# Patient Record
Sex: Male | Born: 1975 | Race: White | Hispanic: No | Marital: Married | Smoking: Former smoker
Health system: Southern US, Community
[De-identification: ages and names within clinical notes are randomized; demographics above are authoritative.]

## PROBLEM LIST (undated history)

## (undated) DIAGNOSIS — J45909 Unspecified asthma, uncomplicated: Secondary | ICD-10-CM

## (undated) DIAGNOSIS — I10 Essential (primary) hypertension: Secondary | ICD-10-CM

## (undated) DIAGNOSIS — R011 Cardiac murmur, unspecified: Secondary | ICD-10-CM

## (undated) HISTORY — PX: CERVICAL DISC SURGERY: SHX588

## (undated) HISTORY — DX: Essential (primary) hypertension: I10

## (undated) HISTORY — DX: Unspecified asthma, uncomplicated: J45.909

## (undated) HISTORY — DX: Cardiac murmur, unspecified: R01.1

## (undated) HISTORY — PX: TYMPANOSTOMY TUBE PLACEMENT: SHX32

---

## 2007-03-07 ENCOUNTER — Emergency Department: Payer: Self-pay

## 2014-04-16 ENCOUNTER — Emergency Department: Payer: Self-pay | Admitting: Emergency Medicine

## 2014-04-16 LAB — CBC
HCT: 46.6 % (ref 40.0–52.0)
HGB: 16.2 g/dL (ref 13.0–18.0)
MCH: 31.8 pg (ref 26.0–34.0)
MCHC: 34.7 g/dL (ref 32.0–36.0)
MCV: 92 fL (ref 80–100)
Platelet: 132 10*3/uL — ABNORMAL LOW (ref 150–440)
RBC: 5.07 10*6/uL (ref 4.40–5.90)
RDW: 13.1 % (ref 11.5–14.5)
WBC: 8.2 10*3/uL (ref 3.8–10.6)

## 2014-04-16 LAB — BASIC METABOLIC PANEL
Anion Gap: 5 — ABNORMAL LOW (ref 7–16)
BUN: 12 mg/dL (ref 7–18)
CHLORIDE: 109 mmol/L — AB (ref 98–107)
CREATININE: 1.1 mg/dL (ref 0.60–1.30)
Calcium, Total: 8.4 mg/dL — ABNORMAL LOW (ref 8.5–10.1)
Co2: 25 mmol/L (ref 21–32)
EGFR (African American): >60
EGFR (Non-African Amer.): >60
GLUCOSE: 106 mg/dL — AB (ref 65–99)
Osmolality: 278 (ref 275–301)
Potassium: 4.1 mmol/L (ref 3.5–5.1)
SODIUM: 139 mmol/L (ref 136–145)

## 2014-04-16 LAB — TROPONIN I: Troponin-I: <0.02 ng/mL

## 2014-11-26 ENCOUNTER — Emergency Department: Payer: Self-pay | Admitting: Emergency Medicine

## 2014-11-26 LAB — CBC WITH DIFFERENTIAL/PLATELET
BASOS ABS: 0.1 10*3/uL (ref 0.0–0.1)
BASOS PCT: 0.7 %
Eosinophil #: 0.2 10*3/uL (ref 0.0–0.7)
Eosinophil %: 2.2 %
HCT: 44.5 % (ref 40.0–52.0)
HGB: 15.1 g/dL (ref 13.0–18.0)
Lymphocyte #: 1.5 10*3/uL (ref 1.0–3.6)
Lymphocyte %: 13.8 %
MCH: 30.9 pg (ref 26.0–34.0)
MCHC: 33.8 g/dL (ref 32.0–36.0)
MCV: 91 fL (ref 80–100)
Monocyte #: 0.8 x10 3/mm (ref 0.2–1.0)
Monocyte %: 7.2 %
Neutrophil #: 8.4 10*3/uL — ABNORMAL HIGH (ref 1.4–6.5)
Neutrophil %: 76.1 %
PLATELETS: 154 10*3/uL (ref 150–440)
RBC: 4.87 10*6/uL (ref 4.40–5.90)
RDW: 13.5 % (ref 11.5–14.5)
WBC: 11.1 10*3/uL — ABNORMAL HIGH (ref 3.8–10.6)

## 2014-11-26 LAB — COMPREHENSIVE METABOLIC PANEL
ALBUMIN: 3.9 g/dL (ref 3.4–5.0)
Alkaline Phosphatase: 44 U/L — ABNORMAL LOW (ref 46–116)
Anion Gap: 4 — ABNORMAL LOW (ref 7–16)
BILIRUBIN TOTAL: 2.3 mg/dL — AB (ref 0.2–1.0)
BUN: 13 mg/dL (ref 7–18)
CHLORIDE: 103 mmol/L (ref 98–107)
CREATININE: 1.19 mg/dL (ref 0.60–1.30)
Calcium, Total: 9.1 mg/dL (ref 8.5–10.1)
Co2: 32 mmol/L (ref 21–32)
EGFR (African American): >60
EGFR (Non-African Amer.): >60
GLUCOSE: 89 mg/dL (ref 65–99)
Osmolality: 277 (ref 275–301)
POTASSIUM: 4.5 mmol/L (ref 3.5–5.1)
SGOT(AST): 27 U/L (ref 15–37)
SGPT (ALT): 33 U/L (ref 14–63)
Sodium: 139 mmol/L (ref 136–145)
Total Protein: 7.5 g/dL (ref 6.4–8.2)

## 2014-11-26 LAB — LIPASE, BLOOD: Lipase: 97 U/L (ref 73–393)

## 2016-02-11 DIAGNOSIS — E039 Hypothyroidism, unspecified: Secondary | ICD-10-CM | POA: Insufficient documentation

## 2016-02-11 DIAGNOSIS — E559 Vitamin D deficiency, unspecified: Secondary | ICD-10-CM | POA: Insufficient documentation

## 2016-02-11 DIAGNOSIS — Z720 Tobacco use: Secondary | ICD-10-CM | POA: Insufficient documentation

## 2016-08-09 ENCOUNTER — Emergency Department
Admission: EM | Admit: 2016-08-09 | Discharge: 2016-08-09 | Disposition: A | Discharge status: Home / Self Care | Payer: BLUE CROSS/BLUE SHIELD | Attending: Student in an Organized Health Care Education/Training Program | Admitting: Student in an Organized Health Care Education/Training Program

## 2016-08-09 ENCOUNTER — Encounter: Payer: Self-pay | Admitting: Emergency Medicine

## 2016-08-09 DIAGNOSIS — M62830 Muscle spasm of back: Secondary | ICD-10-CM

## 2016-08-09 DIAGNOSIS — M549 Dorsalgia, unspecified: Secondary | ICD-10-CM | POA: Diagnosis present

## 2016-08-09 DIAGNOSIS — F17210 Nicotine dependence, cigarettes, uncomplicated: Secondary | ICD-10-CM | POA: Diagnosis not present

## 2016-08-09 MED ORDER — NAPROXEN 500 MG PO TABS
500.0000 mg | ORAL_TABLET | Freq: Two times a day (BID) | ORAL | 0 refills | Status: DC
Start: 2016-08-09 — End: 2023-01-28

## 2016-08-09 MED ORDER — CYCLOBENZAPRINE HCL 10 MG PO TABS: 10.0000 mg | ORAL_TABLET | Freq: Three times a day (TID) | ORAL | 0 refills | Status: DC | PRN

## 2016-08-09 MED ORDER — NAPROXEN 500 MG PO TABS
500.0000 mg | ORAL_TABLET | Freq: Once | ORAL | Status: AC
  Administered 2016-08-09: 500 mg via ORAL
  Filled 2016-08-09: qty 1

## 2016-08-09 MED ORDER — CYCLOBENZAPRINE HCL 10 MG PO TABS
10.0000 mg | ORAL_TABLET | Freq: Once | ORAL | Status: AC
  Administered 2016-08-09: 10 mg via ORAL
  Filled 2016-08-09: qty 1

## 2016-08-09 NOTE — ED Provider Notes (Signed)
[facility] Emergency Department Provider Note ____________________________________________  Time seen: Approximately 9:10 PM  I have reviewed the triage vital signs and the nursing notes.   HISTORY  Chief Complaint Back Pain    HPI Randy Underwood. is a 40 y.o. male who presents to the emergency department for evaluation of back pain. No specific injury. Pain increases significantly with any movement. He states that his job requires lifting and pulling and believes it may be somehow related. He has not taken anything at all for pain. His s/o can feel a "knot" in his back that has gotten bigger over the past few days. Pain radiates into his right scapula.  No past medical history on file.  There are no active problems to display for this patient.   No past surgical history on file.  Prior to Admission medications   Medication Sig Start Date End Date Taking? Authorizing Provider  cyclobenzaprine (FLEXERIL) 10 MG tablet Take 1 tablet (10 mg total) by mouth 3 (three) times daily as needed for muscle spasms. 08/09/16   Chinita Pester, FNP  naproxen (NAPROSYN) 500 MG tablet Take 1 tablet (500 mg total) by mouth 2 (two) times daily with a meal. 08/09/16   Chinita Pester, FNP    Allergies Review of patient's allergies indicates no known allergies.  No family history on file.  Social History Social History  Substance Use Topics  . Smoking status: Current Every Day Smoker    Types: Cigarettes  . Smokeless tobacco: Current User    Types: Snuff  . Alcohol use No    Review of Systems Constitutional: No recent illness. Cardiovascular: Denies chest pain or palpitations. Respiratory: Denies shortness of breath. Musculoskeletal: Pain in right upper back with radiation into right scapula. Skin: Negative for rash, wound, lesion. Neurological: Negative for focal weakness or numbness.  ____________________________________________   PHYSICAL  EXAM:  VITAL SIGNS: ED Triage Vitals [08/09/16 2100]  Enc Vitals Group     BP (!) 127/93     Pulse Rate 79     Resp 16     Temp 98.7 F (37.1 C)     Temp src      SpO2 98 %     Weight 230 lb (104.3 kg)     Height 5\' 9"  (1.753 m)     Head Circumference      Peak Flow      Pain Score      Pain Loc      Pain Edu?      Excl. in GC?     Constitutional: Alert and oriented. Well appearing and in no acute distress. Eyes: Conjunctivae are normal. EOMI. Head: Atraumatic. Neck: No stridor.  Respiratory: Normal respiratory effort.   Musculoskeletal: Tenderness to the paraspinal area of the thorax parallel with the scapula on the right side with palpable muscle spasm. Full ROM of right shoulder. Neurologic:  Normal speech and language. No gross focal neurologic deficits are appreciated. Speech is normal. No gait instability. Skin:  Skin is warm, dry and intact. Atraumatic. Psychiatric: Mood and affect are normal. Speech and behavior are normal.  ____________________________________________   LABS (all labs ordered are listed, but only abnormal results are displayed)  Labs Reviewed - No data to display ____________________________________________  RADIOLOGY  Not indicated. ____________________________________________   PROCEDURES  Procedure(s) performed: None   ____________________________________________   INITIAL IMPRESSION / ASSESSMENT AND PLAN / ED COURSE  Clinical Course    Pertinent labs & imaging results  that were available during my care of the patient were reviewed by me and considered in my medical decision making (see chart for details).  Naprosyn and Flexeril given in the ER with Significant relief of pain. Patient will be given prescriptions for the same. He was instructed to follow-up with orthopedics for symptoms that are not completely relieved or at least well managed with medications. He was given a work excuse for tomorrow and advised to apply heat  or ice whichever feels better for about 20 minutes per hour while awake. He was instructed to return to the emergency department for symptoms that change or worsen if he is unable schedule an appointment. ____________________________________________   FINAL CLINICAL IMPRESSION(S) / ED DIAGNOSES  Final diagnoses:  Muscle spasm of back       Chinita PesterCari B Lisle Skillman, FNP 08/09/16 2233    Willy EddyPatrick Robinson, MD 08/10/16 [phone]

## 2016-08-09 NOTE — ED Triage Notes (Signed)
States back pain, that goes into rt scapula pain. States he has a "lump" on his back and was in so much pain from that, that he fell last night. The lump in a pea sized area under the skin that is painful to the touch.

## 2016-08-11 ENCOUNTER — Encounter (HOSPITAL_COMMUNITY): Payer: Self-pay | Admitting: Neurology

## 2016-08-11 ENCOUNTER — Emergency Department (HOSPITAL_COMMUNITY)
Admission: EM | Admit: 2016-08-11 | Discharge: 2016-08-11 | Disposition: A | Discharge status: Home / Self Care | Payer: BLUE CROSS/BLUE SHIELD | Attending: Emergency Medicine | Admitting: Emergency Medicine

## 2016-08-11 ENCOUNTER — Emergency Department (HOSPITAL_COMMUNITY): Payer: BLUE CROSS/BLUE SHIELD

## 2016-08-11 DIAGNOSIS — Y999 Unspecified external cause status: Secondary | ICD-10-CM | POA: Diagnosis not present

## 2016-08-11 DIAGNOSIS — S3992XA Unspecified injury of lower back, initial encounter: Secondary | ICD-10-CM | POA: Diagnosis present

## 2016-08-11 DIAGNOSIS — F17210 Nicotine dependence, cigarettes, uncomplicated: Secondary | ICD-10-CM | POA: Diagnosis not present

## 2016-08-11 DIAGNOSIS — Y939 Activity, unspecified: Secondary | ICD-10-CM | POA: Insufficient documentation

## 2016-08-11 DIAGNOSIS — X509XXA Other and unspecified overexertion or strenuous movements or postures, initial encounter: Secondary | ICD-10-CM | POA: Insufficient documentation

## 2016-08-11 DIAGNOSIS — S46812A Strain of other muscles, fascia and tendons at shoulder and upper arm level, left arm, initial encounter: Secondary | ICD-10-CM | POA: Diagnosis not present

## 2016-08-11 DIAGNOSIS — S46811A Strain of other muscles, fascia and tendons at shoulder and upper arm level, right arm, initial encounter: Secondary | ICD-10-CM

## 2016-08-11 DIAGNOSIS — S39012A Strain of muscle, fascia and tendon of lower back, initial encounter: Secondary | ICD-10-CM | POA: Diagnosis not present

## 2016-08-11 DIAGNOSIS — Y929 Unspecified place or not applicable: Secondary | ICD-10-CM | POA: Insufficient documentation

## 2016-08-11 DIAGNOSIS — S29019A Strain of muscle and tendon of unspecified wall of thorax, initial encounter: Secondary | ICD-10-CM

## 2016-08-11 MED ORDER — HYDROCODONE-ACETAMINOPHEN 5-325 MG PO TABS: 1.0000 | ORAL_TABLET | Freq: Four times a day (QID) | ORAL | 0 refills | Status: DC | PRN

## 2016-08-11 MED ORDER — PREDNISONE 50 MG PO TABS: 50.0000 mg | ORAL_TABLET | Freq: Every day | ORAL | 0 refills | Status: DC

## 2016-08-11 NOTE — ED Triage Notes (Signed)
Pt here c/o right upper back pain for several days. Noticed a hard lump to back, started having tingling radiating into right arm last night. Denies injuries or falls. Was seen at [facility] and told pulled muscle. Pt is ambulatory. Is a x 4.

## 2016-08-11 NOTE — Discharge Instructions (Signed)
Return here as needed.  Follow-up with your primary doctor.  Use ice and heat on the areas that are sore.  °

## 2016-08-11 NOTE — ED Notes (Signed)
Declined W/C at D/C and was escorted to lobby by RN. 

## 2016-08-11 NOTE — ED Provider Notes (Signed)
[facility]-EMERGENCY DEPT Provider Note   CSN: [id number] Arrival date & time: 08/11/16  1200     History   Chief Complaint Chief Complaint  Patient presents with  . Back Pain    HPI Randy LocksRicky A Manahan Sr. is a 40 y.o. male.  HPI Patient presents to the emergency department with upper back pain, still ongoing over the last week.  Patient states that he was recently seen at [facility] and advised that he has some muscle strain.  Patient states that he is given treatment, not seem to relieve his symptoms.  Patient states he does a lot of bending, twisting, lifting, strenuous work states that he has not recall any specific injuryThe patient denies chest pain, shortness of breath, headache,blurred vision, neck pain, fever, cough, weakness, numbness, dizziness, anorexia, edema, abdominal pain, nausea, vomiting, diarrhea, rash, dysuria, hematemesis, bloody stool, near syncope, or syncope. History reviewed. No pertinent past medical history.  There are no active problems to display for this patient.   History reviewed. No pertinent surgical history.     Home Medications    Prior to Admission medications   Medication Sig Start Date End Date Taking? Authorizing Provider  cyclobenzaprine (FLEXERIL) 10 MG tablet Take 1 tablet (10 mg total) by mouth 3 (three) times daily as needed for muscle spasms. 08/09/16   Chinita Pesterari B Triplett, FNP  HYDROcodone-acetaminophen (NORCO/VICODIN) 5-325 MG tablet Take 1 tablet by mouth every 6 (six) hours as needed for moderate pain. 08/11/16   Charlestine Nighthristopher Jakolby Sedivy, PA-C  naproxen (NAPROSYN) 500 MG tablet Take 1 tablet (500 mg total) by mouth 2 (two) times daily with a meal. 08/09/16   Cari B Triplett, FNP  predniSONE (DELTASONE) 50 MG tablet Take 1 tablet (50 mg total) by mouth daily. 08/11/16   Charlestine Nighthristopher Carle Dargan, PA-C    Family History No family history on file.  Social History Social History  Substance Use Topics  . Smoking status: Current Every Day Smoker     Types: Cigarettes  . Smokeless tobacco: Current User    Types: Snuff  . Alcohol use No     Allergies   Benadryl [diphenhydramine]   Review of Systems Review of Systems All other systems negative except as documented in the HPI. All pertinent positives and negatives as reviewed in the HPI.   Physical Exam Updated Vital Signs BP (!) 164/106   Pulse 73   Temp 98.3 F (36.8 C) (Oral)   Resp 18   SpO2 100%   Physical Exam  Constitutional: He is oriented to person, place, and time. He appears well-developed and well-nourished. No distress.  HENT:  Head: Normocephalic and atraumatic.  Mouth/Throat: Oropharynx is clear and moist.  Eyes: EOM are normal. Pupils are equal, round, and reactive to light.  Neck: Normal range of motion. Neck supple.  Cardiovascular: Normal rate, regular rhythm and normal heart sounds.  Exam reveals no gallop and no friction rub.   No murmur heard. Pulmonary/Chest: Effort normal and breath sounds normal. No respiratory distress. He has no wheezes.  Abdominal: Soft. Bowel sounds are normal. He exhibits no distension. There is no tenderness.  Musculoskeletal:       Back:  Neurological: He is alert and oriented to person, place, and time. He exhibits normal muscle tone. Coordination normal.  Skin: Skin is warm and dry. No rash noted. No erythema.  Psychiatric: He has a normal mood and affect. His behavior is normal.  Nursing note and vitals reviewed.    ED Treatments / Results  Labs (  all labs ordered are listed, but only abnormal results are displayed) Labs Reviewed - No data to display  EKG  EKG Interpretation None       Radiology Dg Cervical Spine Complete  Result Date: 08/11/2016 CLINICAL DATA:  Pt here c/o right upper back pain for several days. Noticed a hard lump to back, started having tingling radiating into right arm last night. Denies injuries or falls. Was seen at [facility] and told pulled muscle. Pt is a.*comment was  truncated* EXAM: CERVICAL SPINE - COMPLETE 4+ VIEW COMPARISON:  None. FINDINGS: There is reversal normal cervical lordosis. Degenerate spurring from C5 through C7. Small fractured osteophyte at C5 is favored chronic. Oblique projections demonstrate no acute neural foraminal narrowing. Open mouth odontoid view demonstrates normal alignment of the lateral masses of C1 on C2. IMPRESSION: 1. No acute findings of the cervical spine. 2. Endplate spurring of the lower cervical spine. 3. Reversal of normal cervical lordosis. Electronically Signed   By: Genevive Bi M.D.   On: 08/11/2016 13:47   Dg Thoracic Spine 2 View  Result Date: 08/11/2016 CLINICAL DATA:  Back pain for several days EXAM: THORACIC SPINE 2 VIEWS COMPARISON:  None. FINDINGS: Four views of thoracic spine submitted. No acute fracture or subluxation. Alignment, disc spaces and vertebral body heights are preserved. IMPRESSION: Negative. Electronically Signed   By: Natasha Mead M.D.   On: 08/11/2016 13:46    Procedures Procedures (including critical care time)  Medications Ordered in ED Medications - No data to display   Initial Impression / Assessment and Plan / ED Course  I have reviewed the triage vital signs and the nursing notes.  Pertinent labs & imaging results that were available during my care of the patient were reviewed by me and considered in my medical decision making (see chart for details).  Clinical Course    Patient is advised of the results and all questions were answered.  I also advised him to use ice and heat on his back.  Patient is advised he will need to follow-up with his primary care Dr. told to return here as needed.  Patient agrees to the plan  Final Clinical Impressions(s) / ED Diagnoses   Final diagnoses:  Thoracic myofascial strain, initial encounter  Strain of right trapezius muscle, initial encounter    New Prescriptions Discharge Medication List as of 08/11/2016  2:19 PM    START taking  these medications   Details  HYDROcodone-acetaminophen (NORCO/VICODIN) 5-325 MG tablet Take 1 tablet by mouth every 6 (six) hours as needed for moderate pain., Starting Tue 08/11/2016, Print    predniSONE (DELTASONE) 50 MG tablet Take 1 tablet (50 mg total) by mouth daily., Starting Tue 08/11/2016, Print         Charlestine Night, PA-C 08/13/16 0005    Laurence Spates, MD 08/13/16 [phone]

## 2016-08-14 ENCOUNTER — Other Ambulatory Visit: Payer: Self-pay | Admitting: Adult Health

## 2016-08-14 DIAGNOSIS — M546 Pain in thoracic spine: Secondary | ICD-10-CM

## 2016-08-25 ENCOUNTER — Ambulatory Visit
Admission: RE | Admit: 2016-08-25 | Discharge: 2016-08-25 | Disposition: A | Discharge status: Home / Self Care | Payer: BLUE CROSS/BLUE SHIELD | Source: Ambulatory Visit | Attending: Adult Health | Admitting: Adult Health

## 2016-08-25 DIAGNOSIS — M546 Pain in thoracic spine: Secondary | ICD-10-CM | POA: Insufficient documentation

## 2016-08-25 DIAGNOSIS — M47812 Spondylosis without myelopathy or radiculopathy, cervical region: Secondary | ICD-10-CM | POA: Insufficient documentation

## 2016-10-02 ENCOUNTER — Other Ambulatory Visit: Payer: Self-pay | Admitting: Neurosurgery

## 2016-10-02 DIAGNOSIS — M4722 Other spondylosis with radiculopathy, cervical region: Secondary | ICD-10-CM

## 2016-10-16 ENCOUNTER — Ambulatory Visit
Admission: RE | Admit: 2016-10-16 | Discharge: 2016-10-16 | Disposition: A | Discharge status: Home / Self Care | Payer: BLUE CROSS/BLUE SHIELD | Source: Ambulatory Visit | Attending: Neurosurgery | Admitting: Neurosurgery

## 2016-10-16 DIAGNOSIS — M4722 Other spondylosis with radiculopathy, cervical region: Secondary | ICD-10-CM

## 2017-05-02 IMAGING — MR MR CERVICAL SPINE W/O CM
5 series · 38 of 48 positions shown · non-contrast
Comparison: None.

CLINICAL DATA: Right-sided neck extending into the right shoulder
and arm. Some weakness.

EXAM:
MRI CERVICAL SPINE WITHOUT CONTRAST
TECHNIQUE: Multiplanar, multisequence MR imaging of the cervical spine was
performed. No intravenous contrast was administered.

[Series 6: T2 · sagittal · 3.0mm · 0.55mm/px · 6 of 15 slices shown (1 of 2)]
[im 1/15]
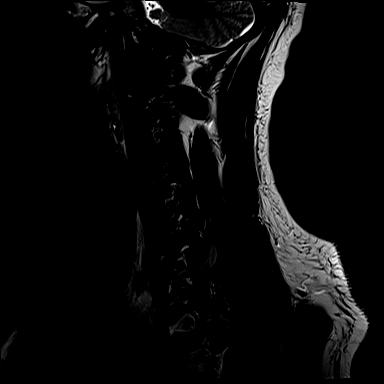
[im 3/15]
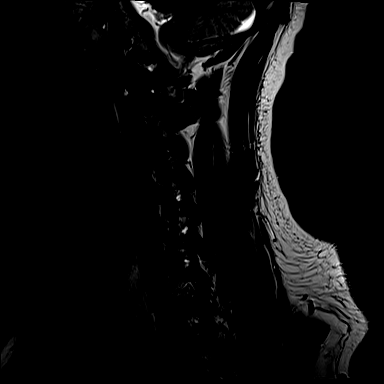
[im 6/15]
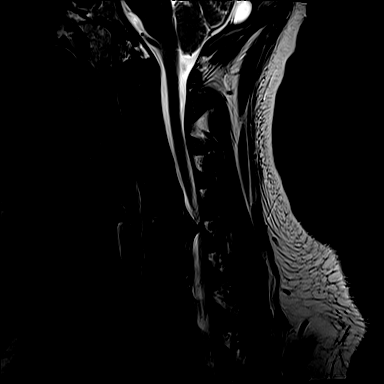
[im 9/15]
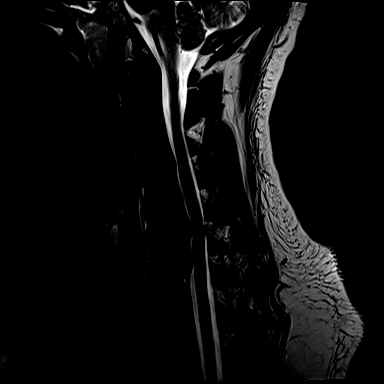
[im 12/15]
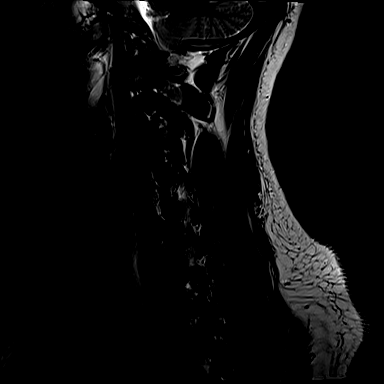
[im 15/15]
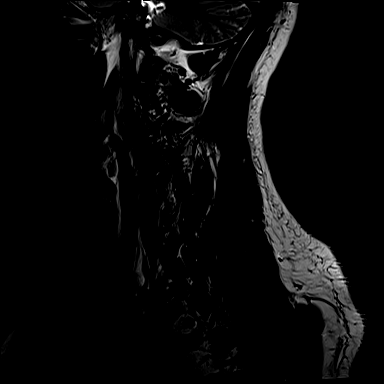

[Series 7: T1 · sagittal · 3.0mm · 0.66mm/px · 7 of 15 slices shown]
[im 1/15]
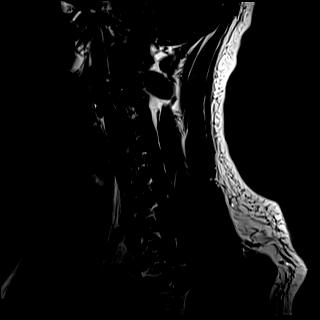
[im 3/15]
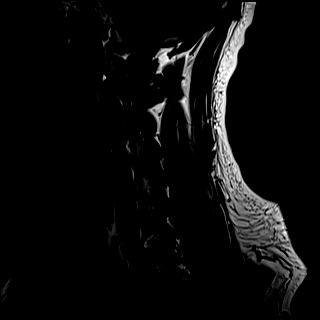
[im 5/15]
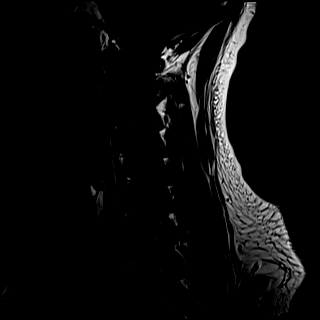
[im 8/15]
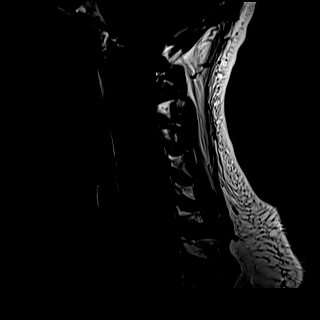
[im 10/15]
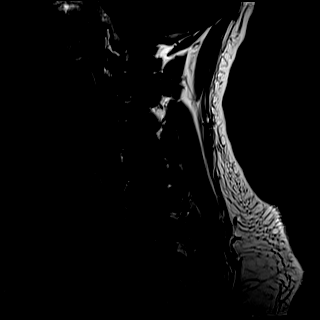
[im 12/15]
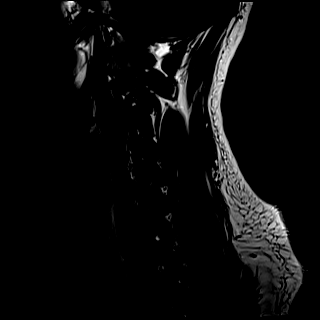
[im 15/15]
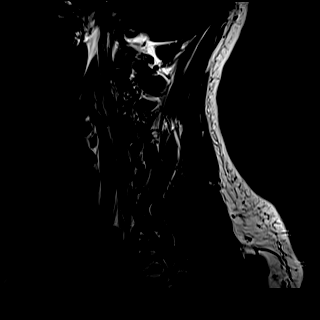

[Series 8: STIR · sagittal · 3.0mm · 0.41mm/px · 7 of 15 slices shown]
[im 1/15]
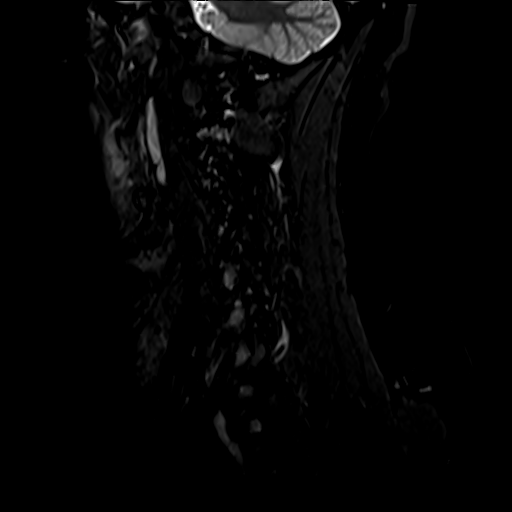
[im 3/15]
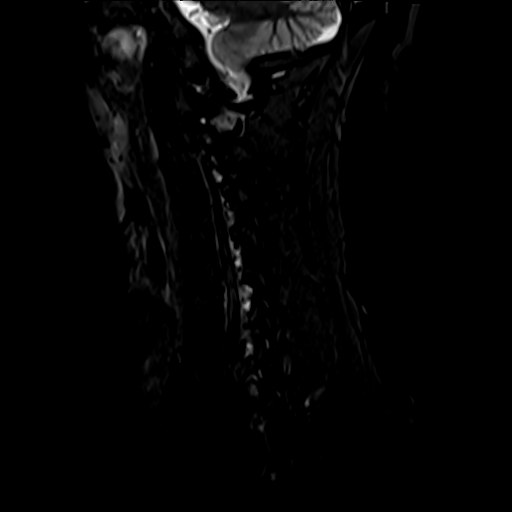
[im 5/15]
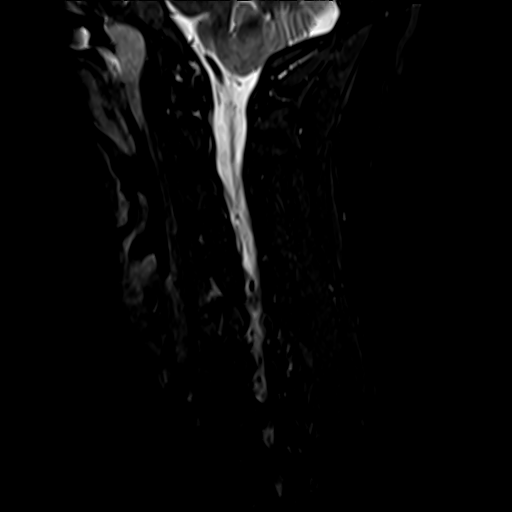
[im 8/15]
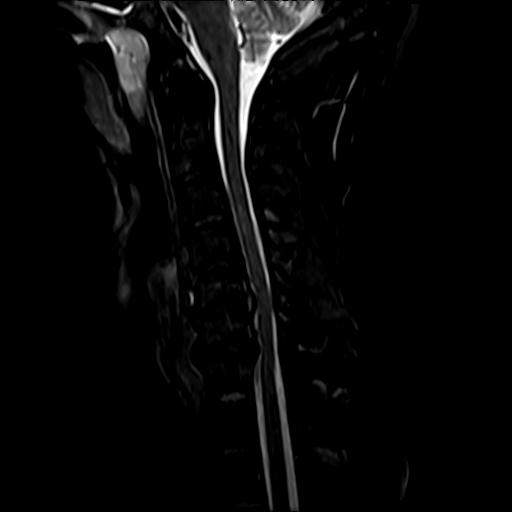
[im 10/15]
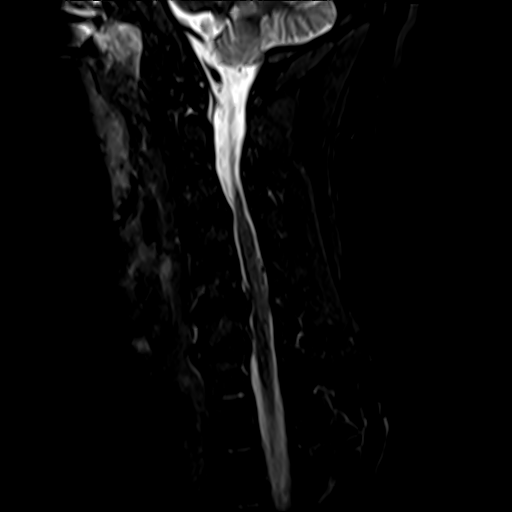
[im 12/15]
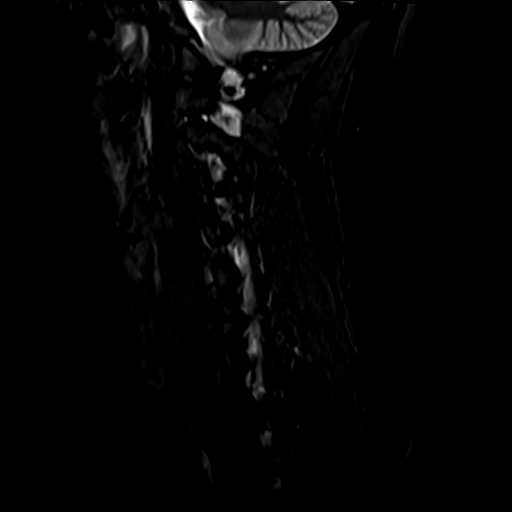
[im 15/15]
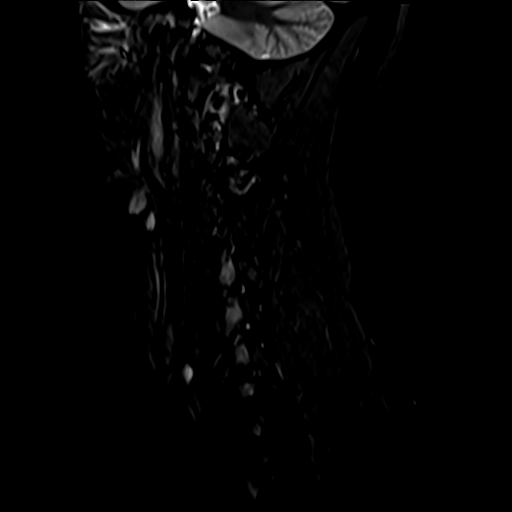

[Series 9: T2 · axial · 3.5mm · 0.50mm/px · z∈[-77,+32]mm · 10 of 30 slices shown (2 of 2)]
[im 1/30]
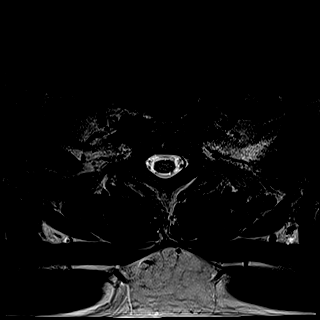
[im 3/30]
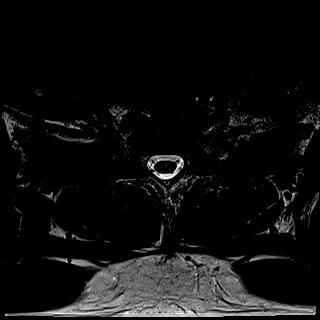
[im 5/30]
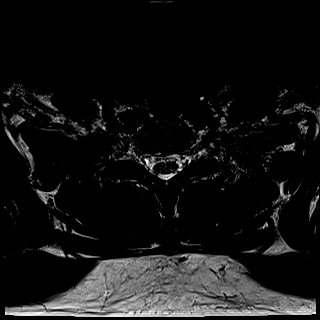
[im 7/30]
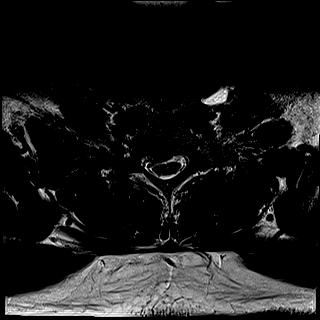
[im 9/30]
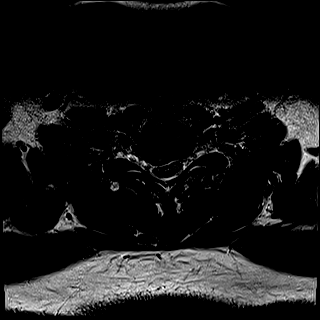
[im 14/30]
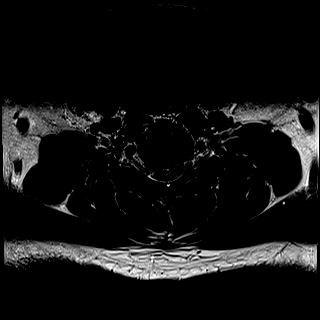
[im 16/30]
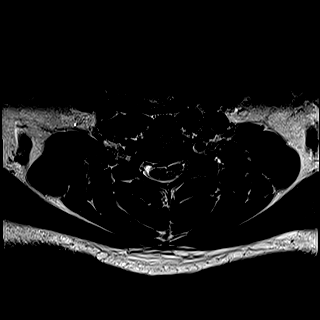
[im 21/30]
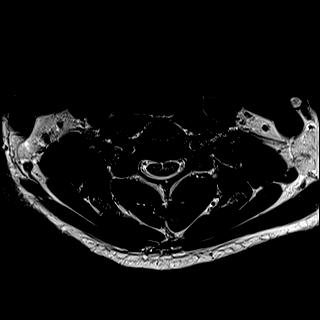
[im 25/30]
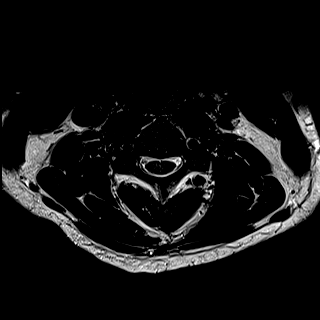
[im 30/30]
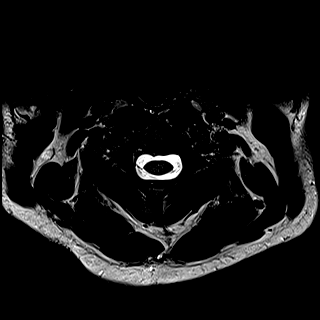

[Series 10: GRE · axial · 3.5mm · 0.83mm/px · z∈[-77,+32]mm · 8 of 30 slices shown]
[im 1/30]
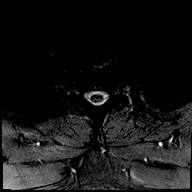
[im 5/30]
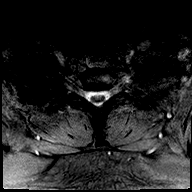
[im 9/30]
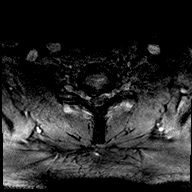
[im 14/30]
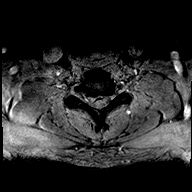
[im 16/30]
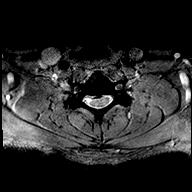
[im 21/30]
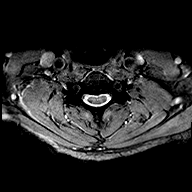
[im 25/30]
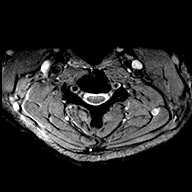
[im 30/30]
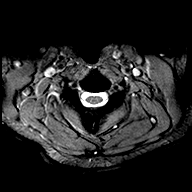

[38 of 48 positions shown; findings below may reference images not displayed]

FINDINGS: Alignment: No static listhesis. Loss of the normal cervical lordosis
with straightening.

Vertebrae: No fracture, evidence of discitis, or bone lesion.

Cord: Normal signal and morphology.

Posterior Fossa, vertebral arteries, paraspinal tissues: No
paraspinal abnormality. Posterior fossa is normal. Vertebral artery
flow voids are maintained.

Disc levels:

Discs: Degenerative disc disease with minimal disc height loss at
C5-6.

C2-3: No significant disc bulge. No neural foraminal stenosis. No
central canal stenosis.

C3-4: No significant disc bulge. Mild left foraminal narrowing. No
right foraminal narrowing. No central canal stenosis.

C4-5: Broad shallow right paracentral disc protrusion. No neural
foraminal stenosis. No central canal stenosis.

C5-6: Large right paracentral disc protrusion with mass effect on
the right paracentral cervical spinal cord. Severe spinal stenosis.
No neural foraminal stenosis.

C6-7: Broad right paracentral disc protrusion contacting the ventral
right paracentral cervical spinal cord. No neural foraminal
stenosis.

C7-T1: No significant disc bulge. No neural foraminal stenosis. No
central canal stenosis.
IMPRESSION: 1. At C5-6 there is a large right paracentral disc protrusion with
mass effect on the right paracentral cervical spinal cord. Severe
spinal stenosis.
2. At C6-7 there is a broad right paracentral disc protrusion
contacting the ventral right paracentral cervical spinal cord.

## 2019-06-27 ENCOUNTER — Other Ambulatory Visit: Payer: Self-pay

## 2019-06-27 DIAGNOSIS — Z20822 Contact with and (suspected) exposure to covid-19: Secondary | ICD-10-CM

## 2019-06-28 LAB — NOVEL CORONAVIRUS, NAA: SARS-CoV-2, NAA: NOT DETECTED

## 2022-12-07 DIAGNOSIS — M47812 Spondylosis without myelopathy or radiculopathy, cervical region: Secondary | ICD-10-CM | POA: Insufficient documentation

## 2023-01-28 ENCOUNTER — Ambulatory Visit (INDEPENDENT_AMBULATORY_CARE_PROVIDER_SITE_OTHER): Payer: Medicaid Other | Admitting: Nurse Practitioner

## 2023-01-28 ENCOUNTER — Encounter: Payer: Self-pay | Admitting: Nurse Practitioner

## 2023-01-28 VITALS — BP 160/92 | HR 80 | Temp 98.1°F | Resp 16 | Ht 69.25 in | Wt 275.2 lb

## 2023-01-28 DIAGNOSIS — R202 Paresthesia of skin: Secondary | ICD-10-CM

## 2023-01-28 DIAGNOSIS — R600 Localized edema: Secondary | ICD-10-CM | POA: Diagnosis not present

## 2023-01-28 DIAGNOSIS — Z87891 Personal history of nicotine dependence: Secondary | ICD-10-CM

## 2023-01-28 DIAGNOSIS — I10 Essential (primary) hypertension: Secondary | ICD-10-CM | POA: Diagnosis not present

## 2023-01-28 DIAGNOSIS — Z13220 Encounter for screening for lipoid disorders: Secondary | ICD-10-CM | POA: Diagnosis not present

## 2023-01-28 DIAGNOSIS — Z720 Tobacco use: Secondary | ICD-10-CM

## 2023-01-28 LAB — COMPREHENSIVE METABOLIC PANEL
ALT: 17 U/L (ref 0–53)
AST: 18 U/L (ref 0–37)
Albumin: 4.4 g/dL (ref 3.5–5.2)
Alkaline Phosphatase: 48 U/L (ref 39–117)
BUN: 10 mg/dL (ref 6–23)
CO2: 28 mEq/L (ref 19–32)
Calcium: 9.1 mg/dL (ref 8.4–10.5)
Chloride: 101 mEq/L (ref 96–112)
Creatinine, Ser: 0.9 mg/dL (ref 0.40–1.50)
GFR: 103.56 mL/min (ref >60.00)
Glucose, Bld: 118 mg/dL — ABNORMAL HIGH (ref 70–99)
Potassium: 4 mEq/L (ref 3.5–5.1)
Sodium: 138 mEq/L (ref 135–145)
Total Bilirubin: 0.9 mg/dL (ref 0.2–1.2)
Total Protein: 6.7 g/dL (ref 6.0–8.3)

## 2023-01-28 LAB — CBC
HCT: 40.6 % (ref 39.0–52.0)
Hemoglobin: 13.9 g/dL (ref 13.0–17.0)
MCHC: 34.3 g/dL (ref 30.0–36.0)
MCV: 91.5 fl (ref 78.0–100.0)
Platelets: 140 10*3/uL — ABNORMAL LOW (ref 150.0–400.0)
RBC: 4.44 Mil/uL (ref 4.22–5.81)
RDW: 15.3 % (ref 11.5–15.5)
WBC: 6.3 10*3/uL (ref 4.0–10.5)

## 2023-01-28 LAB — HEMOGLOBIN A1C: Hgb A1c MFr Bld: 4.9 % (ref 4.6–6.5)

## 2023-01-28 LAB — LIPID PANEL
Cholesterol: 167 mg/dL (ref 0–200)
HDL: 34.1 mg/dL — ABNORMAL LOW (ref >39.00)
Total CHOL/HDL Ratio: 5
Triglycerides: 457 mg/dL — ABNORMAL HIGH (ref 0.0–149.0)

## 2023-01-28 LAB — TSH: TSH: 3.77 u[IU]/mL (ref 0.35–5.50)

## 2023-01-28 LAB — VITAMIN B12: Vitamin B-12: 193 pg/mL — ABNORMAL LOW (ref 211–911)

## 2023-01-28 LAB — LDL CHOLESTEROL, DIRECT: Direct LDL: 84 mg/dL

## 2023-01-28 LAB — BRAIN NATRIURETIC PEPTIDE: Pro B Natriuretic peptide (BNP): 7 pg/mL (ref 0.0–100.0)

## 2023-01-28 MED ORDER — HYDROCHLOROTHIAZIDE 25 MG PO TABS: 25.0000 mg | ORAL_TABLET | Freq: Every day | ORAL | 1 refills | Status: DC

## 2023-01-28 NOTE — Patient Instructions (Signed)
Nice to see you today I will be in touch with the labs once I have them Follow up with me in 1 month for a recheck on your kidneys and your blood pressure

## 2023-01-28 NOTE — Assessment & Plan Note (Signed)
Patient does have paresthesias to bilateral lower extremities and feet could coincide with edema.  Patient has not been seen in the extended period time will check labs inclusive of A1c, electrolytes, CBC, B12, TSH.

## 2023-01-28 NOTE — Progress Notes (Signed)
New Patient Office Visit  Subjective    Patient ID: Randy Underwood., male    DOB: 15-Feb-1976  Age: 47 y.o. MRN: [id number]  CC:  Chief Complaint  Patient presents with   Establish Care   Foot Swelling    HPI Randy PINKHASOV Sr. presents to establish care  Elevated blood pressure: 8 years ago was the lst time he was on blood pressure medications. Blood pressure and fluid pill mix.  Does not have a blood pressure cuff. States that he has tried different blood pressure and cholesterol medications in the past but they made him feel bad.   Foot swelling: both feet. States that it comes and goes. States that over the weekend it was worse and he was off work. States sometimes it is worth with standing and better with elevation. Will have pain tenderness, pins and needles, and temperature change.    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 We did discuss  colonoscopy, cologuard, and iFOB Lung Cancer Screening: Does not qualify  PSA: Too young, currently average risk   Outpatient Encounter Medications as of 01/28/2023  Medication Sig   cetirizine (ZYRTEC) 5 MG tablet Take 5 mg by mouth daily.   hydrochlorothiazide (HYDRODIURIL) 25 MG tablet Take 1 tablet (25 mg total) by mouth daily.   [DISCONTINUED] cyclobenzaprine (FLEXERIL) 10 MG tablet Take 1 tablet (10 mg total) by mouth 3 (three) times daily as needed for muscle spasms. (Patient not taking: Reported on 01/28/2023)   [DISCONTINUED] HYDROcodone-acetaminophen (NORCO/VICODIN) 5-325 MG tablet Take 1 tablet by mouth every 6 (six) hours as needed for moderate pain. (Patient not taking: Reported on 01/28/2023)   [DISCONTINUED] naproxen (NAPROSYN) 500 MG tablet Take 1 tablet (500 mg total) by mouth 2 (two) times daily with a meal.  (Patient not taking: Reported on 01/28/2023)   [DISCONTINUED] predniSONE (DELTASONE) 50 MG tablet Take 1 tablet (50 mg total) by mouth daily. (Patient not taking: Reported on 01/28/2023)   No facility-administered encounter medications on file as of 01/28/2023.    Past Medical History:  Diagnosis Date   Asthma    childhood   Heart murmur    Hypertension     Past Surgical History:  Procedure Laterality Date   CERVICAL DISC SURGERY     TYMPANOSTOMY TUBE PLACEMENT      Family History  Problem Relation Age of Onset   COPD Mother    Heart disease Mother    Diabetes Mother    Emphysema Mother    Asthma Father    Hyperlipidemia Father     Social History   Socioeconomic History   Marital status: Married    Spouse name: Randy Underwood   Number of children: 4   Years of education: Not on file   Highest education level: Not on file  Occupational History   Not on file  Tobacco Use   Smoking status: Former    Packs/day: 1.00    Years: 10.00    Additional pack years: 0.00    Total pack years: 10.00    Types: Cigarettes    Quit date: 10/19/2013    Years since quitting: 9.2   Smokeless tobacco: Current    Types: Snuff  Vaping Use   Vaping Use: Some days   Substances: Nicotine, Flavoring  Substance and Sexual Activity  Alcohol use: Yes    Comment: couple beer and liquor - sometimes daily and weekly   Drug use: No   Sexual activity: Yes    Birth control/protection: None  Other Topics Concern   Not on file  Social History Narrative   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  Eyes:  Negative for blurred vision and double vision.  Respiratory:  Negative for shortness of breath.   Cardiovascular:  Positive for leg swelling. Negative for chest pain.  Gastrointestinal:  Negative for abdominal pain, constipation, diarrhea, nausea and vomiting.       Bm daily   Genitourinary:  Negative for dysuria and hematuria.  Neurological:  Negative for tingling and headaches.  Psychiatric/Behavioral:  Negative for hallucinations and substance abuse.         Objective    BP (!) 160/92   Pulse 80   Temp 98.1 F (36.7 C)   Resp 16   Ht 5' 9.25" (1.759 m)   Wt 275 lb 4 oz (124.9 kg)   SpO2 97%   BMI 40.35 kg/m   Physical Exam Vitals and nursing note reviewed.  Constitutional:      Appearance: Normal appearance. He is obese.  HENT:     Right Ear: Tympanic membrane, ear canal and external ear normal.     Left Ear: Tympanic membrane, ear canal and external ear normal.     Mouth/Throat:     Mouth: Mucous membranes are moist.     Pharynx: Oropharynx is clear.  Eyes:     Extraocular Movements: Extraocular movements intact.     Pupils: Pupils are equal, round, and reactive to light.  Cardiovascular:     Rate and Rhythm: Normal rate and regular rhythm.     Heart sounds: Normal heart sounds.  Pulmonary:     Effort: Pulmonary effort is normal.     Breath sounds: Normal breath sounds.  Musculoskeletal:     Right lower leg: Edema present.     Left lower leg: Edema present.  Lymphadenopathy:     Cervical: No cervical adenopathy.  Neurological:     General: No focal deficit present.     Mental Status: He is alert.     Deep Tendon Reflexes:     Reflex Scores:      Bicep reflexes are 2+ on the right side and 2+ on the left side.      Patellar reflexes are 2+ on the right side and 2+ on the left side.    Comments: Bilateral upper and lower extremity strength 5/5         Assessment & Plan:   Problem List Items Addressed This Visit        Cardiovascular and Mediastinum   Primary hypertension - Primary    Patient blood pressure above goal today.  Historical diagnosis of hypertension been off medication approximately 8 years.  Given patient does have lower extremity edema we will start with hydrochlorothiazide 25 mg daily.  Follow-up 1 month for electrolyte recheck and blood pressure recheck.  Pending labs today also      Relevant Medications   hydrochlorothiazide (HYDRODIURIL) 25 MG tablet   Other Relevant Orders   CBC   Comprehensive metabolic panel   TSH   Brain natriuretic peptide     Other   Smokeless tobacco use   Lower extremity edema    Multifactorial.  Will check labs inclusive of TSH and BNP.  Will start patient on diuretic to help with fluid retention and blood pressure.  Pending labs      Relevant Orders   Brain natriuretic peptide   Morbid obesity    Pending labs inclusive of lipid, TSH, A1c.      Relevant Orders   Hemoglobin A1c   Lipid panel   Paresthesias    Patient does have paresthesias to bilateral lower extremities and feet could coincide with edema.  Patient has not been seen in the extended period time will check labs inclusive of A1c, electrolytes, CBC, B12, TSH.      Relevant Orders   CBC   Vitamin B12   Comprehensive metabolic panel   TSH   Former smoker   Other Visit Diagnoses     Screening for lipid disorders       Relevant Orders   Lipid panel       Return in about 4 weeks (around 02/25/2023) for BP recheck.   Audria NineMatt Brach Birdsall, NP

## 2023-01-28 NOTE — Assessment & Plan Note (Signed)
Patient blood pressure above goal today.  Historical diagnosis of hypertension been off medication approximately 8 years.  Given patient does have lower extremity edema we will start with hydrochlorothiazide 25 mg daily.  Follow-up 1 month for electrolyte recheck and blood pressure recheck.  Pending labs today also

## 2023-01-28 NOTE — Assessment & Plan Note (Signed)
Pending labs inclusive of lipid, TSH, A1c.

## 2023-01-28 NOTE — Assessment & Plan Note (Signed)
Multifactorial.  Will check labs inclusive of TSH and BNP.  Will start patient on diuretic to help with fluid retention and blood pressure.  Pending labs

## 2023-02-25 ENCOUNTER — Ambulatory Visit (INDEPENDENT_AMBULATORY_CARE_PROVIDER_SITE_OTHER): Payer: Medicaid Other | Admitting: Nurse Practitioner

## 2023-02-25 ENCOUNTER — Encounter: Payer: Self-pay | Admitting: Nurse Practitioner

## 2023-02-25 VITALS — BP 134/84 | HR 75 | Temp 97.9°F | Resp 16 | Ht 69.25 in | Wt 274.5 lb

## 2023-02-25 DIAGNOSIS — R600 Localized edema: Secondary | ICD-10-CM

## 2023-02-25 DIAGNOSIS — I10 Essential (primary) hypertension: Secondary | ICD-10-CM | POA: Diagnosis not present

## 2023-02-25 DIAGNOSIS — Z1211 Encounter for screening for malignant neoplasm of colon: Secondary | ICD-10-CM | POA: Diagnosis not present

## 2023-02-25 LAB — BASIC METABOLIC PANEL
BUN: 11 mg/dL (ref 6–23)
CO2: 27 mEq/L (ref 19–32)
Calcium: 9.1 mg/dL (ref 8.4–10.5)
Chloride: 101 mEq/L (ref 96–112)
Creatinine, Ser: 0.95 mg/dL (ref 0.40–1.50)
GFR: 97 mL/min (ref >60.00)
Glucose, Bld: 114 mg/dL — ABNORMAL HIGH (ref 70–99)
Potassium: 4.1 mEq/L (ref 3.5–5.1)
Sodium: 137 mEq/L (ref 135–145)

## 2023-02-25 MED ORDER — HYDROCHLOROTHIAZIDE 25 MG PO TABS: 25.0000 mg | ORAL_TABLET | Freq: Every day | ORAL | 3 refills | Status: DC

## 2023-02-25 NOTE — Progress Notes (Signed)
   Established Patient Office Visit  Subjective   Patient ID: Randy Underwood., male    DOB: [birth year]  Age: 47 y.o. MRN: [id number]  Chief Complaint  Patient presents with   Hypertension      HTN: states that the swelling is better but has some days when he has some swelling with dependence.  States that after he goes to bed and gets up that he is back to his normal.  Patient is working Aeronautical engineer.  Does not have a blood pressure cuff at home. States that he tolerates the medication well.    Review of Systems  Constitutional:  Negative for chills and fever.  Respiratory:  Negative for shortness of breath.   Cardiovascular:  Negative for chest pain.  Neurological:  Negative for headaches.  Psychiatric/Behavioral:  Negative for hallucinations and suicidal ideas.       Objective:     BP 134/84   Pulse 75   Temp 97.9 F (36.6 C)   Resp 16   Ht 5' 9.25" (1.759 m)   Wt 274 lb 8 oz (124.5 kg)   SpO2 96%   BMI 40.24 kg/m  BP Readings from Last 3 Encounters:  02/25/23 134/84  01/28/23 (!) 160/92  08/11/16 (!) 164/106   Wt Readings from Last 3 Encounters:  02/25/23 274 lb 8 oz (124.5 kg)  01/28/23 275 lb 4 oz (124.9 kg)  08/09/16 230 lb (104.3 kg)      Physical Exam Vitals and nursing note reviewed.  Constitutional:      Appearance: Normal appearance.  Cardiovascular:     Rate and Rhythm: Normal rate and regular rhythm.     Heart sounds: Normal heart sounds.  Pulmonary:     Effort: Pulmonary effort is normal.     Breath sounds: Normal breath sounds.  Musculoskeletal:     Right lower leg: No edema.     Left lower leg: No edema.  Neurological:     Mental Status: He is alert.      No results found for any visits on 02/25/23.    The 10-year ASCVD risk score (Arnett DK, et al., 2019) is: 3.1%    Assessment & Plan:   Problem List Items Addressed This Visit       Cardiovascular and Mediastinum   Primary hypertension - Primary    Blood pressure  under control with hydrochlorothiazide 25 mg daily.  Recheck electrolytes and renal function today.  Continue HCTZ 25 mg daily refill provided today      Relevant Medications   hydrochlorothiazide (HYDRODIURIL) 25 MG tablet   Other Relevant Orders   Basic metabolic panel     Other   Lower extremity edema    Was responsive to HCTZ 25 mg daily.  Pending labs continue medication as prescribed refills of today      Other Visit Diagnoses     Screening for colon cancer       Relevant Orders   Fecal occult blood, imunochemical       Return in about 1 year (around 02/25/2024) for CPE and Labs.    Audria Nine, NP

## 2023-02-25 NOTE — Assessment & Plan Note (Signed)
Blood pressure under control with hydrochlorothiazide 25 mg daily.  Recheck electrolytes and renal function today.  Continue HCTZ 25 mg daily refill provided today

## 2023-02-25 NOTE — Patient Instructions (Signed)
Nice to see you today Bring the stool kit back after you complete it I have refilled the HCTZ for a year. I will be in touch with the labs once I have reviewed them Follow up with me in 1 year for you physical and labs

## 2023-02-25 NOTE — Assessment & Plan Note (Signed)
Was responsive to HCTZ 25 mg daily.  Pending labs continue medication as prescribed refills of today

## 2023-09-20 ENCOUNTER — Encounter: Payer: Self-pay | Admitting: Nurse Practitioner

## 2023-09-20 ENCOUNTER — Ambulatory Visit (INDEPENDENT_AMBULATORY_CARE_PROVIDER_SITE_OTHER): Payer: Medicaid Other | Admitting: Nurse Practitioner

## 2023-09-20 VITALS — BP 144/88 | HR 63 | Temp 98.0°F | Ht 69.25 in | Wt 274.0 lb

## 2023-09-20 DIAGNOSIS — M79672 Pain in left foot: Secondary | ICD-10-CM

## 2023-09-20 DIAGNOSIS — M79671 Pain in right foot: Secondary | ICD-10-CM | POA: Insufficient documentation

## 2023-09-20 DIAGNOSIS — E538 Deficiency of other specified B group vitamins: Secondary | ICD-10-CM | POA: Diagnosis not present

## 2023-09-20 DIAGNOSIS — G5793 Unspecified mononeuropathy of bilateral lower limbs: Secondary | ICD-10-CM | POA: Insufficient documentation

## 2023-09-20 DIAGNOSIS — I10 Essential (primary) hypertension: Secondary | ICD-10-CM | POA: Diagnosis not present

## 2023-09-20 DIAGNOSIS — E039 Hypothyroidism, unspecified: Secondary | ICD-10-CM | POA: Diagnosis not present

## 2023-09-20 LAB — BASIC METABOLIC PANEL
BUN: 8 mg/dL (ref 6–23)
CO2: 28 meq/L (ref 19–32)
Calcium: 9 mg/dL (ref 8.4–10.5)
Chloride: 100 meq/L (ref 96–112)
Creatinine, Ser: 1.01 mg/dL (ref 0.40–1.50)
GFR: 89.77 mL/min (ref >60.00)
Glucose, Bld: 146 mg/dL — ABNORMAL HIGH (ref 70–99)
Potassium: 3.8 meq/L (ref 3.5–5.1)
Sodium: 137 meq/L (ref 135–145)

## 2023-09-20 LAB — CBC
HCT: 42.6 % (ref 39.0–52.0)
Hemoglobin: 14.4 g/dL (ref 13.0–17.0)
MCHC: 33.9 g/dL (ref 30.0–36.0)
MCV: 93.4 fL (ref 78.0–100.0)
Platelets: 137 10*3/uL — ABNORMAL LOW (ref 150.0–400.0)
RBC: 4.56 Mil/uL (ref 4.22–5.81)
RDW: 14 % (ref 11.5–15.5)
WBC: 5.3 10*3/uL (ref 4.0–10.5)

## 2023-09-20 LAB — TSH: TSH: 3.44 u[IU]/mL (ref 0.35–5.50)

## 2023-09-20 LAB — VITAMIN B12: Vitamin B-12: 307 pg/mL (ref 211–911)

## 2023-09-20 LAB — URIC ACID: Uric Acid, Serum: 9 mg/dL — ABNORMAL HIGH (ref 4.0–7.8)

## 2023-09-20 MED ORDER — GABAPENTIN 100 MG PO CAPS: 100.0000 mg | ORAL_CAPSULE | Freq: Every day | ORAL | 2 refills | Status: AC

## 2023-09-20 NOTE — Assessment & Plan Note (Signed)
Ambiguous in nature do think it is neuropathic in nature but given soreness and tenderness with weightbearing and palpation will check uric acid as patient is on hydrochlorothiazide.  Pending labs today

## 2023-09-20 NOTE — Assessment & Plan Note (Signed)
Patient blood pressure above goal at and above goal on recheck.  Patient currently maintained on hydrochlorothiazide 25 mg.  Continue work on lifestyle modifications at follow-up if patient's blood pressure still above goal consider additional medication at that juncture

## 2023-09-20 NOTE — Assessment & Plan Note (Signed)
Ambulatory referral to neurology will try gabapentin 100 mg - 300 mg nightly sedation precautions reviewed

## 2023-09-20 NOTE — Patient Instructions (Signed)
Nice to see you today Start with 100mg  of the gabapentin at bedtime. After a couple of days you can increase to 200mg  if needed then after a few more days can go to 300mg  at bedtime if needed  Follow up with me in 3 months for a recheck

## 2023-09-20 NOTE — Assessment & Plan Note (Signed)
History of the same.  Patient has been taking over-the-counter supplementation.  Pending B12 level today

## 2023-09-20 NOTE — Assessment & Plan Note (Signed)
Pending TSH today. 

## 2023-09-20 NOTE — Progress Notes (Signed)
Acute Office Visit  Subjective:     Patient ID: Randy Gumz., male    DOB: 23-Oct-1975, 47 y.o.   MRN: [id number]  Chief Complaint  Patient presents with   Foot Pain    Bilateral foot pain Describes as feeling pins and needles in feet making it difficult to walk.Had gotten worse over the last 6 months    Foot Pain Pertinent negatives include no chest pain, chills, fever, headaches or weakness.   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 States worse when he first gets off of them. States that it is 10-7mins of eacerbatino. Stter he gets back up they will hurt again and feel sore. States that last time his b12 was low and he did take the b12 and is still taking it  Described as pins/needles, asleep, and pain. States that he feels better with tennis shoes. States that he has tried insoles without releif   Review of Systems  Constitutional:  Negative for chills and fever.  Respiratory:  Negative for shortness of breath.   Cardiovascular:  Negative for chest pain.  Musculoskeletal:  Positive for joint pain.  Neurological:  Positive for tingling. Negative for weakness and headaches.        Objective:    BP (!) 144/88   Pulse 63   Temp 98 F (36.7 C) (Oral)   Ht 5' 9.25" (1.759 m)   Wt 274 lb (124.3 kg)   SpO2 99%   BMI 40.17 kg/m  BP Readings from Last 3 Encounters:  09/20/23 (!) 144/88  02/25/23 134/84  01/28/23 (!) 160/92   Wt Readings from Last 3 Encounters:  09/20/23 274 lb (124.3 kg)  02/25/23 274 lb 8 oz (124.5 kg)  01/28/23 275 lb 4 oz (124.9 kg)   SpO2 Readings from Last 3 Encounters:  09/20/23 99%  02/25/23 96%  01/28/23 97%      Physical Exam Vitals and nursing note reviewed.  Constitutional:      Appearance: Normal appearance.  Cardiovascular:     Rate and Rhythm: Normal rate and  regular rhythm.     Pulses:          Dorsalis pedis pulses are 2+ on the right side and 2+ on the left side.       Posterior tibial pulses are 2+ on the right side and 2+ on the left side.     Heart sounds: Normal heart sounds.  Pulmonary:     Effort: Pulmonary effort is normal.     Breath sounds: Normal breath sounds.  Musculoskeletal:     Right lower leg: No edema.     Left lower leg: No edema.  Neurological:     Mental Status: He is alert.     No results found for any visits on 09/20/23.      Assessment & Plan:   Problem List Items Addressed This Visit       Cardiovascular and Mediastinum   Primary hypertension    Patient blood pressure above goal at and above goal on recheck.  Patient currently maintained on hydrochlorothiazide 25 mg.  Continue work on lifestyle modifications at follow-up if patient's blood pressure still above goal consider additional medication at that juncture      Relevant Orders   Basic metabolic panel   CBC   Uric acid  Endocrine   Acquired hypothyroidism    Pending TSH today      Relevant Orders   TSH     Other   Neuropathic pain of both feet - Primary    Ambulatory referral to neurology will try gabapentin 100 mg - 300 mg nightly sedation precautions reviewed      Relevant Medications   gabapentin (NEURONTIN) 100 MG capsule   Other Relevant Orders   Ambulatory referral to Neurology   Bilateral foot pain    Ambiguous in nature do think it is neuropathic in nature but given soreness and tenderness with weightbearing and palpation will check uric acid as patient is on hydrochlorothiazide.  Pending labs today      Relevant Orders   Uric acid   Vitamin B12 deficiency    History of the same.  Patient has been taking over-the-counter supplementation.  Pending B12 level today      Relevant Orders   Vitamin B12    Meds ordered this encounter  Medications   gabapentin (NEURONTIN) 100 MG capsule    Sig: Take 1-3 capsules  (100-300 mg total) by mouth at bedtime.    Dispense:  90 capsule    Refill:  2    Order Specific Question:   Supervising Provider    Answer:   Roxy Manns A [[id number]]    Return in about 3 months (around 12/19/2023) for BP recheck, foot pain .  Audria Nine, NP

## 2023-09-22 ENCOUNTER — Other Ambulatory Visit: Payer: Self-pay | Admitting: Nurse Practitioner

## 2023-09-22 DIAGNOSIS — E790 Hyperuricemia without signs of inflammatory arthritis and tophaceous disease: Secondary | ICD-10-CM

## 2023-09-22 MED ORDER — ALLOPURINOL 100 MG PO TABS: 100.0000 mg | ORAL_TABLET | Freq: Every day | ORAL | 1 refills | Status: DC

## 2023-09-25 ENCOUNTER — Encounter: Payer: Self-pay | Admitting: *Deleted

## 2023-10-22 ENCOUNTER — Other Ambulatory Visit (INDEPENDENT_AMBULATORY_CARE_PROVIDER_SITE_OTHER): Payer: Medicaid Other

## 2023-10-22 DIAGNOSIS — E790 Hyperuricemia without signs of inflammatory arthritis and tophaceous disease: Secondary | ICD-10-CM | POA: Diagnosis not present

## 2023-10-22 LAB — URIC ACID: Uric Acid, Serum: 8.2 mg/dL — ABNORMAL HIGH (ref 4.0–7.8)

## 2023-10-22 LAB — COMPREHENSIVE METABOLIC PANEL
ALT: 20 U/L (ref 0–53)
AST: 20 U/L (ref 0–37)
Albumin: 4.4 g/dL (ref 3.5–5.2)
Alkaline Phosphatase: 41 U/L (ref 39–117)
BUN: 13 mg/dL (ref 6–23)
CO2: 30 meq/L (ref 19–32)
Calcium: 9.2 mg/dL (ref 8.4–10.5)
Chloride: 101 meq/L (ref 96–112)
Creatinine, Ser: 1.07 mg/dL (ref 0.40–1.50)
GFR: 83.71 mL/min (ref >60.00)
Glucose, Bld: 122 mg/dL — ABNORMAL HIGH (ref 70–99)
Potassium: 3.9 meq/L (ref 3.5–5.1)
Sodium: 139 meq/L (ref 135–145)
Total Bilirubin: 1.1 mg/dL (ref 0.2–1.2)
Total Protein: 7.1 g/dL (ref 6.0–8.3)

## 2023-10-28 ENCOUNTER — Other Ambulatory Visit: Payer: Self-pay | Admitting: Nurse Practitioner

## 2023-10-28 DIAGNOSIS — E790 Hyperuricemia without signs of inflammatory arthritis and tophaceous disease: Secondary | ICD-10-CM

## 2023-10-28 MED ORDER — ALLOPURINOL 100 MG PO TABS: 200.0000 mg | ORAL_TABLET | Freq: Every day | ORAL | 0 refills | Status: DC

## 2023-11-15 ENCOUNTER — Ambulatory Visit: Payer: Self-pay | Admitting: Nurse Practitioner

## 2023-11-15 ENCOUNTER — Ambulatory Visit (INDEPENDENT_AMBULATORY_CARE_PROVIDER_SITE_OTHER): Payer: Medicaid Other | Admitting: Nurse Practitioner

## 2023-11-15 VITALS — BP 138/90 | HR 76 | Temp 98.3°F | Ht 69.25 in | Wt 264.0 lb

## 2023-11-15 DIAGNOSIS — J029 Acute pharyngitis, unspecified: Secondary | ICD-10-CM

## 2023-11-15 DIAGNOSIS — R52 Pain, unspecified: Secondary | ICD-10-CM

## 2023-11-15 DIAGNOSIS — J101 Influenza due to other identified influenza virus with other respiratory manifestations: Secondary | ICD-10-CM | POA: Insufficient documentation

## 2023-11-15 DIAGNOSIS — R110 Nausea: Secondary | ICD-10-CM

## 2023-11-15 DIAGNOSIS — R051 Acute cough: Secondary | ICD-10-CM | POA: Diagnosis not present

## 2023-11-15 LAB — POCT RAPID STREP A (OFFICE): Rapid Strep A Screen: NEGATIVE

## 2023-11-15 LAB — POCT FLU A/B STATUS
Influenza A, POC: POSITIVE — AB
Influenza B, POC: NEGATIVE

## 2023-11-15 LAB — POC COVID19 BINAXNOW: SARS Coronavirus 2 Ag: NEGATIVE

## 2023-11-15 MED ORDER — OSELTAMIVIR PHOSPHATE 75 MG PO CAPS: 75.0000 mg | ORAL_CAPSULE | Freq: Two times a day (BID) | ORAL | 0 refills | Status: AC

## 2023-11-15 MED ORDER — ONDANSETRON 4 MG PO TBDP: 4.0000 mg | ORAL_TABLET | Freq: Three times a day (TID) | ORAL | 0 refills | Status: DC | PRN

## 2023-11-15 NOTE — Patient Instructions (Signed)
Nice to see you today I have sent in the medications to the pharmacy  Follow up if you do not improve

## 2023-11-15 NOTE — Progress Notes (Signed)
Acute Office Visit  Subjective:     Patient ID: Randy Underwood., male    DOB: Dec 26, 1975, 48 y.o.   MRN: [id number]  Chief Complaint  Patient presents with   flu symptoms    Pt complains of cough (phlegm brown/greenish color), chills,body aches, vomiting, sore throat that started Saturday evening.  Pt states that his coworkers he's been around have had the flu.     HPI Patient is in today for sick symptoms with a history of htn, hypothyroidism, former smoker, and obesity   Symptoms started on Saturday evening/ Sunday morning  Covid vaccine: not up to date Flu vaccine: not up to date Sick contacts: people at work had the flu   Has been taking tylenol cole and flu and ibuprofen with minimal relief     Review of Systems  Constitutional:  Positive for chills, fever and malaise/fatigue.  HENT:  Positive for sinus pain and sore throat. Negative for ear discharge and ear pain.   Respiratory:  Positive for cough, sputum production (green and brown) and shortness of breath (DOE and with coughing jaf).   Cardiovascular:  Negative for chest pain.  Gastrointestinal:  Positive for vomiting. Negative for abdominal pain, diarrhea and nausea.  Musculoskeletal:  Positive for myalgias.  Neurological:  Positive for headaches.        Objective:    BP (!) 138/90   Pulse 76   Temp 98.3 F (36.8 C) (Oral)   Ht 5' 9.25" (1.759 m)   Wt 264 lb (119.7 kg)   SpO2 98%   BMI 38.71 kg/m  BP Readings from Last 3 Encounters:  11/15/23 (!) 138/90  09/20/23 (!) 144/88  02/25/23 134/84   Wt Readings from Last 3 Encounters:  11/15/23 264 lb (119.7 kg)  09/20/23 274 lb (124.3 kg)  02/25/23 274 lb 8 oz (124.5 kg)   SpO2 Readings from Last 3 Encounters:  11/15/23 98%  09/20/23 99%  02/25/23 96%      Physical Exam Vitals and nursing note reviewed.  Constitutional:      Appearance: Normal appearance.  HENT:     Right Ear: Ear canal and external ear normal.     Left Ear: Ear canal  and external ear normal.     Nose:     Right Sinus: No maxillary sinus tenderness or frontal sinus tenderness.     Left Sinus: No maxillary sinus tenderness or frontal sinus tenderness.     Mouth/Throat:     Mouth: Mucous membranes are moist.     Pharynx: Oropharynx is clear.  Cardiovascular:     Rate and Rhythm: Normal rate and regular rhythm.     Heart sounds: Normal heart sounds.  Pulmonary:     Effort: Pulmonary effort is normal.     Breath sounds: Normal breath sounds.  Abdominal:     General: Bowel sounds are normal. There is no distension.     Palpations: There is no mass.     Tenderness: There is no abdominal tenderness.     Hernia: No hernia is present.  Lymphadenopathy:     Cervical: No cervical adenopathy.  Neurological:     Mental Status: He is alert.     Results for orders placed or performed in visit on 11/15/23  Rapid Strep A  Result Value Ref Range   Rapid Strep A Screen Negative Negative  POC COVID-19  Result Value Ref Range   SARS Coronavirus 2 Ag Negative Negative  POCT Flu A & B  Status  Result Value Ref Range   Influenza A, POC Positive (A) Negative   Influenza B, POC Negative Negative        Assessment & Plan:   Problem List Items Addressed This Visit       Respiratory   Influenza A   Will test positive for influenza A in office.  Will treat patient with Tamiflu 75 mg twice daily for 5 days.  Did review purpose medication with side effects.  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  Work excuse given in office  COVID and strep test negative in office.      Relevant Medications   oseltamivir (TAMIFLU) 75 MG capsule     Other   Sore throat - Primary   Flu, strep, and COVID test in office.  Rest drink plenty of fluid over-the-counter analgesics as needed      Relevant Orders   Rapid Strep A (Completed)   POC COVID-19 (Completed)   POCT Flu A & B Status (Completed)   Acute  cough   Flu and COVID in office.      Relevant Orders   POC COVID-19 (Completed)   POCT Flu A & B Status (Completed)   Body aches   Flu and COVID test in office.  Patient use over-the-counter analgesics as needed      Relevant Orders   POC COVID-19 (Completed)   POCT Flu A & B Status (Completed)   Nausea   Has resolved with the ondansetron 4 mg 3 times daily as needed.  Flu test in office      Relevant Medications   ondansetron (ZOFRAN-ODT) 4 MG disintegrating tablet    Meds ordered this encounter  Medications   oseltamivir (TAMIFLU) 75 MG capsule    Sig: Take 1 capsule (75 mg total) by mouth 2 (two) times daily for 5 days.    Dispense:  10 capsule    Refill:  0    Supervising Provider:   Roxy Manns A [[id number]]   ondansetron (ZOFRAN-ODT) 4 MG disintegrating tablet    Sig: Take 1 tablet (4 mg total) by mouth every 8 (eight) hours as needed.    Dispense:  20 tablet    Refill:  0    Supervising Provider:   Roxy Manns A [[id number]]    Return in about 2 months (around 01/13/2024), or if symptoms worsen or fail to improve, for BP recheck.  Audria Nine, NP

## 2023-11-15 NOTE — Assessment & Plan Note (Signed)
Has resolved with the ondansetron 4 mg 3 times daily as needed.  Flu test in office

## 2023-11-15 NOTE — Assessment & Plan Note (Addendum)
Flu, strep, and COVID test in office.  Rest drink plenty of fluid over-the-counter analgesics as needed

## 2023-11-15 NOTE — Telephone Encounter (Signed)
Chief Complaint: Flu symptoms Symptoms: Saturday Frequency: constant Pertinent Negatives: Patient denies ear pain, difficulty breathing, chest pain Disposition: [] ED /[] Urgent Care (no appt availability in office) / [x] Appointment(In office/virtual)/ []  [facility]/ [] Home Care/ [] Refused Recommended Disposition /[] [facility]/ []  Follow-up with PCP Additional Notes: Patient called in stating he is experiencing flu like symptoms since Saturday. Patient is having nausea, dizziness, low-grade fever, diarrhea, body aches. Patient states he was in direct contact with his boss who has tested positive for the flu. Patient would like evaluation and recommendations, along with doctors note at this time. Virtual appointment created for patient for today.    Copied from CRM [id number]. Topic: Clinical - Red Word Triage >> Nov 15, 2023 11:48 AM Drema Balzarine wrote: Red Word that prompted transfer to Nurse Triage: Patient experience cough, dizziness, nausea, fever of 100.3 , diarhhea - boss has the flu Reason for Disposition  [1] Influenza EXPOSURE (Close Contact) within last 7 days AND [2] NO respiratory symptoms  Answer Assessment - Initial Assessment Questions 1. TYPE of EXPOSURE: "How were you exposed?" (e.g., close contact, not a close contact)     Boss tested positive for the flu on Thursday afternoon, patient was in direct contact 2. DATE of EXPOSURE: "When did the exposure occur?" (e.g., hour, days, weeks)     Thursday 4. HIGH RISK for COMPLICATIONS: "Do you have any heart or lung problems?" "Do you have a weakened immune system?" (e.g., CHF, COPD, asthma, HIV positive, chemotherapy, renal failure, diabetes mellitus, sickle cell anemia)     High blood pressure 5. SYMPTOMS: "Do you have any symptoms?" (e.g., cough, fever, sore throat, difficulty breathing).     Dizziness, nausea, low grade fever, cough, nausea  Protocols used: Influenza (Flu) Exposure-A-AH

## 2023-11-15 NOTE — Assessment & Plan Note (Signed)
Flu and COVID test in office.  Patient use over-the-counter analgesics as needed

## 2023-11-15 NOTE — Assessment & Plan Note (Addendum)
Will test positive for influenza A in office.  Will treat patient with Tamiflu 75 mg twice daily for 5 days.  Did review purpose medication with side effects.  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  Work excuse given in office  COVID and strep test negative in office.

## 2023-11-15 NOTE — Assessment & Plan Note (Signed)
Flu and COVID in office.

## 2023-12-11 ENCOUNTER — Other Ambulatory Visit: Payer: Self-pay | Admitting: Nurse Practitioner

## 2023-12-11 DIAGNOSIS — E790 Hyperuricemia without signs of inflammatory arthritis and tophaceous disease: Secondary | ICD-10-CM

## 2023-12-12 NOTE — Telephone Encounter (Signed)
 I have future labs put in but I do not see an appointment scheduled. He needs those done to get more refills

## 2023-12-13 NOTE — Telephone Encounter (Signed)
 Unable to reach patient. Left voicemail to return call to our office.

## 2023-12-14 NOTE — Telephone Encounter (Signed)
 lvmtcb

## 2023-12-17 ENCOUNTER — Other Ambulatory Visit: Payer: Medicaid Other

## 2023-12-17 DIAGNOSIS — E790 Hyperuricemia without signs of inflammatory arthritis and tophaceous disease: Secondary | ICD-10-CM | POA: Diagnosis not present

## 2023-12-17 LAB — COMPREHENSIVE METABOLIC PANEL
ALT: 24 U/L (ref 0–53)
AST: 22 U/L (ref 0–37)
Albumin: 4.3 g/dL (ref 3.5–5.2)
Alkaline Phosphatase: 38 U/L — ABNORMAL LOW (ref 39–117)
BUN: 11 mg/dL (ref 6–23)
CO2: 29 meq/L (ref 19–32)
Calcium: 9.2 mg/dL (ref 8.4–10.5)
Chloride: 104 meq/L (ref 96–112)
Creatinine, Ser: 1.01 mg/dL (ref 0.40–1.50)
GFR: 89.62 mL/min (ref >60.00)
Glucose, Bld: 125 mg/dL — ABNORMAL HIGH (ref 70–99)
Potassium: 3.6 meq/L (ref 3.5–5.1)
Sodium: 140 meq/L (ref 135–145)
Total Bilirubin: 0.9 mg/dL (ref 0.2–1.2)
Total Protein: 7.2 g/dL (ref 6.0–8.3)

## 2023-12-17 LAB — URIC ACID: Uric Acid, Serum: 9.2 mg/dL — ABNORMAL HIGH (ref 4.0–7.8)

## 2023-12-22 ENCOUNTER — Encounter: Payer: Self-pay | Admitting: Nurse Practitioner

## 2023-12-22 DIAGNOSIS — E790 Hyperuricemia without signs of inflammatory arthritis and tophaceous disease: Secondary | ICD-10-CM

## 2023-12-22 DIAGNOSIS — M79671 Pain in right foot: Secondary | ICD-10-CM

## 2024-01-04 MED ORDER — ALLOPURINOL 300 MG PO TABS: 300.0000 mg | ORAL_TABLET | Freq: Every day | ORAL | 6 refills | Status: DC

## 2024-01-04 NOTE — Telephone Encounter (Signed)
 Copied from CRM [id number]. Topic: Clinical - Request for Lab/Test Order >> Jan 04, 2024  8:30 AM Adele Barthel wrote: Reason for CRM:   Patient received message from provider that if he wanted to increase his allopurinol he would need to have another lab visit to check his uric acid. No orders were in chart when wife called to schedule appt. Provider is wanting uric acid checked, patient would like to have labs performed prior to work on Thursday is possible.   CB#   [id number]

## 2024-01-04 NOTE — Addendum Note (Signed)
 Addended by: Eden Emms on: 01/04/2024 01:11 PM   Modules accepted: Orders

## 2024-01-05 NOTE — Telephone Encounter (Signed)
 Please call patent to set up lab order as requested.

## 2024-01-05 NOTE — Telephone Encounter (Signed)
 Lvmtcb

## 2024-01-05 NOTE — Telephone Encounter (Signed)
 Orders are placed in future. But it needs to be a month after he starts taking the increased dose

## 2024-04-04 ENCOUNTER — Other Ambulatory Visit: Payer: Self-pay | Admitting: Nurse Practitioner

## 2024-04-04 DIAGNOSIS — I10 Essential (primary) hypertension: Secondary | ICD-10-CM

## 2024-04-05 NOTE — Telephone Encounter (Signed)
 Patient is overdue for an office visit. Please schedule in next 30 days for BP follow up

## 2024-04-05 NOTE — Telephone Encounter (Signed)
 Lvmtcb, sent mychart message

## 2024-04-06 NOTE — Telephone Encounter (Signed)
 Pt sch ov with e2c2 for tomorrow, 6/20

## 2024-04-07 ENCOUNTER — Ambulatory Visit (INDEPENDENT_AMBULATORY_CARE_PROVIDER_SITE_OTHER): Admitting: Nurse Practitioner

## 2024-04-07 VITALS — BP 138/74 | HR 61 | Temp 98.3°F | Ht 69.25 in | Wt 267.6 lb

## 2024-04-07 DIAGNOSIS — E790 Hyperuricemia without signs of inflammatory arthritis and tophaceous disease: Secondary | ICD-10-CM | POA: Diagnosis not present

## 2024-04-07 DIAGNOSIS — M79671 Pain in right foot: Secondary | ICD-10-CM

## 2024-04-07 DIAGNOSIS — M79672 Pain in left foot: Secondary | ICD-10-CM

## 2024-04-07 DIAGNOSIS — G5793 Unspecified mononeuropathy of bilateral lower limbs: Secondary | ICD-10-CM

## 2024-04-07 DIAGNOSIS — I10 Essential (primary) hypertension: Secondary | ICD-10-CM

## 2024-04-07 LAB — COMPREHENSIVE METABOLIC PANEL WITH GFR
ALT: 23 U/L (ref 0–53)
AST: 21 U/L (ref 0–37)
Albumin: 4.3 g/dL (ref 3.5–5.2)
Alkaline Phosphatase: 32 U/L — ABNORMAL LOW (ref 39–117)
BUN: 10 mg/dL (ref 6–23)
CO2: 29 meq/L (ref 19–32)
Calcium: 9.5 mg/dL (ref 8.4–10.5)
Chloride: 104 meq/L (ref 96–112)
Creatinine, Ser: 0.98 mg/dL (ref 0.40–1.50)
GFR: 92.72 mL/min (ref >60.00)
Glucose, Bld: 118 mg/dL — ABNORMAL HIGH (ref 70–99)
Potassium: 4.4 meq/L (ref 3.5–5.1)
Sodium: 138 meq/L (ref 135–145)
Total Bilirubin: 1 mg/dL (ref 0.2–1.2)
Total Protein: 6.7 g/dL (ref 6.0–8.3)

## 2024-04-07 LAB — URIC ACID: Uric Acid, Serum: 5.6 mg/dL (ref 4.0–7.8)

## 2024-04-07 MED ORDER — LOSARTAN POTASSIUM 25 MG PO TABS: 25.0000 mg | ORAL_TABLET | Freq: Every day | ORAL | 0 refills | Status: DC

## 2024-04-07 MED ORDER — HYDROCHLOROTHIAZIDE 25 MG PO TABS: 25.0000 mg | ORAL_TABLET | Freq: Every day | ORAL | 0 refills | Status: DC

## 2024-04-07 NOTE — Assessment & Plan Note (Signed)
 Patient currently maintained on hydrochlorothiazide  25 mg daily.  Patient still not well-controlled he is on the cusp of normal.  We will continue hydrochlorothiazide  25 mg daily.  Will add on losartan 25 mg daily.  Patient will follow-up in 1 month for BP and BMP recheck.

## 2024-04-07 NOTE — Assessment & Plan Note (Signed)
 Pending repeat uric acid level today.  Patient on allopurinol  300 mg daily

## 2024-04-07 NOTE — Patient Instructions (Signed)
 Nice to see you today  I will be in touch with labs once I have them Follow up with me in 1 month

## 2024-04-07 NOTE — Assessment & Plan Note (Signed)
 Seen by me and neurology.  Currently maintained on gabapentin  and allopurinol .  Pending labs today

## 2024-04-07 NOTE — Progress Notes (Signed)
 Established Patient Office Visit  Subjective   Patient ID: Randy Allemand., male    DOB: [birth year]  Age: 48 y.o. MRN: [id number]  Chief Complaint  Patient presents with   Medication Refill    Hydrochlorothiazide . Pt has no complaints on medication.        [id number] last seen by me on 11/15/2023 for an acute complain and on 09/20/2023 for chronic helath. He is currenlty maintained on hydrochlorothiazide  25mg  daily for blood pressure and is here for follow up. 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   He is eating 1 time a day in the summer and some snacks   Bilateral foot pain: Patient has been seen by me in the past for the same we did note that he had elevated uric acid level and was titrating up on allopurinol .  Patient currently maintained on allopurinol  300 mg daily.  He did miss his last lab draw.  He is also being followed by neurology currently on 900 mg of gabapentin  nightly with the option of adding additional 300 prior to that dose if needed.     Review of Systems  Constitutional:  Negative for chills and fever.  Respiratory:  Negative for shortness of breath.   Cardiovascular:  Negative for chest pain.  Neurological:  Negative for dizziness.      Objective:     BP 138/74   Pulse 61   Temp 98.3 F (36.8 C) (Oral)   Ht 5' 9.25 (1.759 m)   Wt 267 lb 9.6 oz (121.4 kg)   SpO2 97%   BMI 39.23 kg/m  BP Readings from Last 3 Encounters:  04/07/24 138/74  11/15/23 (!) 138/90  09/20/23 (!) 144/88   Wt Readings from Last 3 Encounters:  04/07/24 267 lb 9.6 oz (121.4 kg)  11/15/23 264 lb (119.7 kg)  09/20/23 274 lb (124.3 kg)   SpO2 Readings from Last 3 Encounters:  04/07/24 97%  11/15/23 98%  09/20/23 99%      Physical Exam Vitals and nursing note reviewed.  Constitutional:      Appearance: Normal appearance.  HENT:      Right Ear: Tympanic membrane, ear canal and external ear normal.     Left Ear: Tympanic membrane, ear canal and external ear normal.   Cardiovascular:     Rate and Rhythm: Normal rate and regular rhythm.     Heart sounds: Normal heart sounds.  Pulmonary:     Effort: Pulmonary effort is normal.     Breath sounds: Normal breath sounds.   Musculoskeletal:     Right lower leg: No edema.     Left lower leg: No edema.   Neurological:     Mental Status: He is alert.      No results found for any visits on 04/07/24.    The 10-year ASCVD risk score (Arnett DK, et al., 2019) is: 9.1%    Assessment & Plan:   Problem List Items Addressed This Visit       Cardiovascular and Mediastinum   Primary hypertension - Primary   Patient currently maintained on hydrochlorothiazide  25 mg daily.  Patient still not well-controlled he is on the cusp of normal.  We will continue hydrochlorothiazide  25 mg daily.  Will add on losartan 25 mg daily.  Patient will follow-up in 1 month for BP and BMP  recheck.      Relevant Medications   losartan (COZAAR) 25 MG tablet   hydrochlorothiazide  (HYDRODIURIL ) 25 MG tablet     Other   Neuropathic pain of both feet   Currently followed by Dr. Cullen Dose.  On gabapentin  100 mg nightly.  Also had a elevated uric acid level pending CMP and uric acid patient currently maintained on allopurinol  300 mg daily.      Bilateral foot pain   Seen by me and neurology.  Currently maintained on gabapentin  and allopurinol .  Pending labs today      Elevated uric acid in blood   Pending repeat uric acid level today.  Patient on allopurinol  300 mg daily       Return in about 4 weeks (around 05/05/2024) for BP recheck/bmp.    Margarie Shay, NP

## 2024-04-07 NOTE — Assessment & Plan Note (Signed)
 Currently followed by Dr. Cullen Dose.  On gabapentin  100 mg nightly.  Also had a elevated uric acid level pending CMP and uric acid patient currently maintained on allopurinol  300 mg daily.

## 2024-04-12 ENCOUNTER — Ambulatory Visit: Payer: Self-pay | Admitting: Nurse Practitioner

## 2024-05-09 ENCOUNTER — Ambulatory Visit (INDEPENDENT_AMBULATORY_CARE_PROVIDER_SITE_OTHER): Admitting: Nurse Practitioner

## 2024-05-09 VITALS — BP 138/80 | HR 67 | Temp 97.8°F | Ht 69.25 in | Wt 269.8 lb

## 2024-05-09 DIAGNOSIS — Z1211 Encounter for screening for malignant neoplasm of colon: Secondary | ICD-10-CM | POA: Diagnosis not present

## 2024-05-09 DIAGNOSIS — G5793 Unspecified mononeuropathy of bilateral lower limbs: Secondary | ICD-10-CM

## 2024-05-09 DIAGNOSIS — I10 Essential (primary) hypertension: Secondary | ICD-10-CM

## 2024-05-09 LAB — BASIC METABOLIC PANEL WITH GFR
BUN: 13 mg/dL (ref 6–23)
CO2: 28 meq/L (ref 19–32)
Calcium: 9.1 mg/dL (ref 8.4–10.5)
Chloride: 103 meq/L (ref 96–112)
Creatinine, Ser: 0.92 mg/dL (ref 0.40–1.50)
GFR: 99.96 mL/min (ref >60.00)
Glucose, Bld: 136 mg/dL — ABNORMAL HIGH (ref 70–99)
Potassium: 3.6 meq/L (ref 3.5–5.1)
Sodium: 138 meq/L (ref 135–145)

## 2024-05-09 NOTE — Progress Notes (Signed)
   Established Patient Office Visit  Subjective   Patient ID: Randy Yurchak., male    DOB: December 31, 1975  Age: 48 y.o. MRN: [id number]  Chief Complaint  Patient presents with   Blood Pressure Check    HPI   HTN: Patient was last seen by me on 04/07/2024.  Patient was maintained on hydrochlorothiazide  0.5 mL daily and blood pressure was the cusp of normal.  We did add on losartan  25 mg daily here for follow-up. States that he has been doing well on the medication and does not check blood  Neuropathy: he is followed by Dr. Lane. He was started on nortriptyline 10mg  and planning to increase to 30mg  nightly. He was to continue the gabapentin  300mg  in the evening and 900mg  at night.  States that discomfort will come and go.  As of late he is on his feet more and it has been hot so has been more present.   Review of Systems  Constitutional:  Negative for chills and fever.  Respiratory:  Negative for shortness of breath.   Cardiovascular:  Negative for chest pain.  Neurological:  Positive for tingling. Negative for dizziness and headaches.      Objective:     BP 138/80   Pulse 67   Temp 97.8 F (36.6 C) (Oral)   Ht 5' 9.25 (1.759 m)   Wt 269 lb 12.8 oz (122.4 kg)   SpO2 98%   BMI 39.56 kg/m  BP Readings from Last 3 Encounters:  05/09/24 138/80  04/07/24 138/74  11/15/23 (!) 138/90   Wt Readings from Last 3 Encounters:  05/09/24 269 lb 12.8 oz (122.4 kg)  04/07/24 267 lb 9.6 oz (121.4 kg)  11/15/23 264 lb (119.7 kg)   SpO2 Readings from Last 3 Encounters:  05/09/24 98%  04/07/24 97%  11/15/23 98%      Physical Exam Vitals and nursing note reviewed.  Constitutional:      Appearance: Normal appearance.  Cardiovascular:     Rate and Rhythm: Normal rate and regular rhythm.     Heart sounds: Normal heart sounds.  Pulmonary:     Effort: Pulmonary effort is normal.     Breath sounds: Normal breath sounds.  Neurological:     Mental Status: He is alert.       No results found for any visits on 05/09/24.    The 10-year ASCVD risk score (Arnett DK, et al., 2019) is: 9.1%    Assessment & Plan:   Problem List Items Addressed This Visit       Cardiovascular and Mediastinum   Primary hypertension - Primary   Patient currently maintained on losartan  25 mg daily and hydrochlorothiazide  25 mg daily.  Pending BMP today.  Please take medication as prescribed      Relevant Orders   Basic metabolic panel with GFR     Other   Neuropathic pain of both feet   Patient is currently followed by neurology.  Patient currently maintained on gabapentin  and recently started on nortriptyline.  Continue taking medication as prescribed follow-up with specialist as recommended.      Other Visit Diagnoses       Screening for colon cancer       Relevant Orders   Cologuard       Return in about 10 months (around 03/09/2025) for CPE and Labs.    Adina Crandall, NP

## 2024-05-09 NOTE — Assessment & Plan Note (Signed)
 Patient currently maintained on losartan  25 mg daily and hydrochlorothiazide  25 mg daily.  Pending BMP today.  Please take medication as prescribed

## 2024-05-09 NOTE — Patient Instructions (Signed)
 Nice to see you today I will be in touch with the labs once I have them  Follow up with me in approx 10 months for your physical, sooner if you need me

## 2024-05-09 NOTE — Assessment & Plan Note (Signed)
 Patient is currently followed by neurology.  Patient currently maintained on gabapentin  and recently started on nortriptyline.  Continue taking medication as prescribed follow-up with specialist as recommended.

## 2024-05-11 ENCOUNTER — Ambulatory Visit: Payer: Self-pay | Admitting: Nurse Practitioner

## 2024-07-01 ENCOUNTER — Other Ambulatory Visit: Payer: Self-pay | Admitting: Nurse Practitioner

## 2024-07-01 DIAGNOSIS — I10 Essential (primary) hypertension: Secondary | ICD-10-CM

## 2024-07-29 ENCOUNTER — Other Ambulatory Visit: Payer: Self-pay | Admitting: Nurse Practitioner

## 2024-07-29 DIAGNOSIS — I10 Essential (primary) hypertension: Secondary | ICD-10-CM

## 2024-11-01 ENCOUNTER — Telehealth: Payer: Self-pay

## 2024-11-01 DIAGNOSIS — E790 Hyperuricemia without signs of inflammatory arthritis and tophaceous disease: Secondary | ICD-10-CM

## 2024-11-01 MED ORDER — ALLOPURINOL 300 MG PO TABS: 300.0000 mg | ORAL_TABLET | Freq: Every day | ORAL | 6 refills | Status: AC

## 2024-11-01 NOTE — Telephone Encounter (Signed)
 LAST APPOINTMENT DATE: 05/09/2024  NEXT APPOINTMENT DATE: Visit date not found   Allopurinol  300 mg LAST REFILL: 01/04/24   QTY: #30, 6RF

## 2024-11-01 NOTE — Addendum Note (Signed)
 Addended by: WENDEE LYNWOOD HERO on: 11/01/2024 03:54 PM   Modules accepted: Orders

## 2025-02-06 ENCOUNTER — Encounter: Admitting: Nurse Practitioner
